# Patient Record
Sex: Female | Born: 2000 | Race: White | Hispanic: No | Marital: Single
Health system: Northeastern US, Academic
[De-identification: ages and names within clinical notes are randomized; demographics above are authoritative.]

---
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
## 2021-03-08 NOTE — Progress Notes (Signed)
* * *        **Carolyn Reed    --- ---    22 Y old Female, DOB: [birth year], External MRN: [id number]    Account Number: [id number]    [address] [place], [id number]    Home: [phone]    Insurance: [employer] PPO Payer ID: PAPER    PCP: Renetta Chalk, MD Referring: Renetta Chalk, MD External Visit ID:  [id number]    Appointment Facility: [facility]        * * *    10/19/2017  PN: Merwyn Katos, MD **CHN#:** [id number]    --- ---    ---        Current Medications    ---    Taking     * None     ---    * Medication List reviewed and reconciled with the patient    ---      Past Medical History    ---       Medical History Verified.[name]        ---      Surgical History    ---      tonsils    ---      Family History    ---      gf-bladder,htn.    ---      Social History    ---    Tobacco history: Never smoked.    Sexual history  Never sexually active.    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    Exercise: soccer.      Gyn History    ---    GPAL: g0.      Allergies    ---      N.K.D.A.    ---    [name] Verified]      Hospitalization/Major Diagnostic Procedure    ---      Denies Past Hospitalization    ---      Review of Systems    ---     _OB/GYN ROS_ :    General no fevers,no weakness,no memory loss,no swollen glands,no bruising,no  weight loss,no constant fatigue,no physical abuse,no emotional abuse. HEENT no  visual problems,no eye pain,no ear pain,no difficulty hearing,no headaches,no  sinus trouble. Skin no changing moles,no rash,no jaundice. Cardiovascular no  chest pains,no palpitations,no swelling,no dizziness,no murmurs. Respiratory  no shortness of breath,no cough,no wheezing. GI no nausea,no vomiting,no loss  of appetite,no constipation,no diarrhea. Urinary no painful urination,no  leakage of urine,no frequent urination,no blood in urine. Musculoskeletal no  joint swelling,no joint stiffness. Breast no breast lumps,no breast pain,no  nipple discharge. Psych no depression,no anxiety.             Reason for Appointment    ---      1\. NP/SECONDAY AMENORRHEA    ---      History of Present Illness    ---     _GENERAL_ :    oligomenorrhea    03/2016 had one day of cramping and 1-2 days of bleeding    06/2016    06/2017    not sexually active    no hot flashes    denies hirsutism, headaches, vision changes    no issues w bowels/bladder    no issues w acne    exercises on treadmill, soceer, swimming, and track    denies dieting    denies stressors    sleeps 8+ hrs a day.      Vital Signs    ---  Pain scale 0, Ht-in 65, Wt-lbs 128, BMI 21.30, BP 112/70.      Physical Examination    ---     _OB/Gyn General_ :    General Appearance: well-appearing, no acute distress. Mental Status: alert  and oriented. Mood/Affect: pleasant.    _OB/Gyn Neck_ :    Thyroid: unremarkable. Neck Mass: none.    _OB/Gyn Chest/Thorax_ :    Breath sounds: clear to auscultation. Respiratory Effort normal.    _OB/Gyn Heart_ :    Rhythm: regular. Murmurs: none. Rate: normal.    _OB/Gyn Breasts_ :    General: no masses, tenderness, skin changes, or nipple abnormality, Tanner  stage 4. Skin: unremarkable. Axilla: no palpable masses.    _OB/Gyn Abdomen_ :    General: soft. Tenderness: nontender. Masses: none. Hernia: absent.  Distention: none.    _OB/Gyn Skin_ :    General: warm, moist, no rash.    _OB/Gyn Genitourinary_ :    External Genitalia: normal; tanner 4-5.          Assessments    ---    1\. 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 Primary oligomenorrhea**    _LAB: Thyroid Stimulating Hormone (TSH)_ 1.33   Thyroid Stimulating Hormone  (TSH)  1.33    0.35 - 4.94 - uIU/mL    --- --- --- ---      Judie Bonus 10/19/2017 3:08:26 PM >    --- ---    Gerald Stabs: Prolactin (PROL)_ 7.1 Prolactin  7.1    5.2 - 26.5 - ng/mL    --- --- --- ---      Judie Bonus 10/19/2017 3:08:21 PM >    --- ---    Gerald Stabs: Follicular Stimulating Hormone (FSH)_ 2.7 Follicular Stimulating  Hormone  2.7     \- mIU/mL    --- --- --- ---       Judie Bonus 10/19/2017 3:08:16 PM >    --- ---    Gerald Stabs: Lutenizing Hormone (LH)_ 0.5 Lutenizing Hormone (LH)  0.5     \- mIU/mL    --- --- --- ---      Judie Bonus 10/19/2017 3:08:37 PM >    --- ---    Gerald Stabs: Beta HCG Quant (HCG)_ neg Beta HCG Quant  <5.0    0.0 - 5.0 - mIU/mL    --- --- --- ---      Judie Bonus 10/19/2017 3:08:32 PM >    --- ---    _LAB: DHEA-Sulfate (DHEAS)_    _LAB: 17-Hydroxyprogesterone (OHPRO)_    _LAB: Testosterone Free/Total (TESFT)_    Notes: 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      Follow Up    ---    1 Year    Electronically signed by Merwyn Katos , MD on 10/20/2017 at 09:07 PM EST    Sign off status: Completed        * * *        [facility]    [address] [place], [place] [address]    Tel: [phone]    Fax: [phone]              * * *  Patient: Carolyn Reed, Carolyn Reed DOB: 2001/04/11 Progress Note: Merwyn Katos,  MD 10/19/2017    ---    Note generated by eClinicalWorks EMR/PM Software ([email or web address])

## 2021-03-08 NOTE — Progress Notes (Signed)
* * *        **Carolyn Reed    --- ---    57 Y old Female, DOB: 09-05-2001, External MRN: [id number]    Account Number: [id number]    [address] [place], [id number]    Home: [phone]    Insurance: C[employer] PPO Payer ID: PAPER    PCP: Renetta Chalk, MD Referring: Renetta Chalk, MD External Visit ID:  [id number]    Appointment Facility: [facility]        * * *    09/06/2018  Progress Notes: Merwyn Katos, MD **CHN#:** [id number]    --- ---    ---         **Current Medications**    ---    Taking     * Loestrin Fe 1/20 1-20 MG-MCG Tablet 1 tablet Orally Once a day    ---    * Loestrin Fe 1/20 1-20 MG-MCG Tablet 1 tablet Orally Once a day    ---    * None     ---      **Surgical History**    ---       tonsils    ---      **Family History**    ---       gf-bladder,htn.    ---       **Social History**    ---    Exercise: soccer.    Sexual history  Never sexually active.    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    Tobacco history: Never smoked.   \+ cross country running.    ---       **Review of Systems**    ---     _OB/GYN ROS_ :    General no fevers, no weakness, no memory loss, no swollen glands, no  bruising, no weight loss, no constant fatigue, no physical abuse, no emotional  abuse. HEENT no visual problems, no eye pain, no ear pain, no difficulty  hearing, no headaches, no sinus trouble. Skin no changing moles, no rash, no  jaundice. Cardiovascular no chest pains, no palpitations, no swelling, no  dizziness, no murmurs. Respiratory no shortness of breath, no cough, no  wheezing. GI no nausea, no vomiting, no loss of appetite, no constipation, no  diarrhea. Urinary no painful urination, no leakage of urine, no frequent  urination, no blood in urine. Musculoskeletal no joint swelling, no joint  stiffness. Breast no breast lumps, no breast pain, no nipple discharge. Psych  no depression, no anxiety.            **Reason for Appointment**    ---       1\. BC FOLLOWUP    ---       **History of  Present Illness**    ---     _GENERAL_ :    tolerating ocps well    happy to have menses q month    minimal cramping.       **Vital Signs**    ---    Pain scale 0, Ht-in 65, Wt-lbs 127, BMI 21.13, BP 100/70.       **Physical Examination**    ---     _OB/Gyn General_ :    General Appearance: well-appearing, no acute distress. Mental Status: alert  and oriented. Mood/Affect: pleasant.          **Assessments**    ---    1\. 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 Primary oligomenorrhea**    Notes: reviewed HPO axis maturity    reviewed how cross country running can cause hypo hypo    pt desires stopping ocps    if no menses x 3 months, will repeat labs, consider resuming ocps    will call.    ---      **Follow Up**    ---    prn    Electronically signed by Merwyn Katos , MD on 09/06/2018 at 04:05 PM EDT    Sign off status: Completed        * * [facility]    [address] [place], [place] [address]    Tel: [phone]    Fax: [phone]              * * *          Patient: Carolyn Reed, Carolyn Reed DOB: 2000-12-16 Progress Note: Merwyn Katos,  MD 09/06/2018    ---    Note generated by eClinicalWorks EMR/PM Software ([email or web address])

## 2021-03-08 NOTE — Progress Notes (Signed)
* * *        **  [name]    --- ---    76 Y old Female, DOB: 03-04-2001    [address] [place], [place] [address]    Home: [phone]    Provider: Merwyn Katos        * * *    Telephone Encounter    ---    Answered by   Meyer Russel  Date: 08/08/2018         Time: 08:30 AM    Caller   pt mother    --- ---            Reason   rx refill            Message                      Hi,            Pt is calling for her refill for her bc Loestrin Fe 1/20 1-20 MG-MCG Tablet at the confirmed pharmacy above. Please reach out [phone].            Thank You                Action Taken                      Atocha,Jessica  08/08/2018 8:33:42 AM >       Bray,Paula  08/08/2018 8:40:25 AM > pt seen in [facility] 10/19/17.  discussed ocp. mpm called 02/2018 and pt. was prescribed loestrin fe 1/20.   mom was informed needs 4 months f/u after initiating ocp.  refills sent mom informed pt needs f/u appt. advised call [facility] to schedule number given                       Notes                      Hi,            Pt is calling requesting her refill for her bc Loestrin Fe 1/20 1-20 MG-MCG Tablet to the confirmed pharmacy. Please reach out at [phone].            Thank You                Refills  Start Loestrin Fe 1/20 Tablet, 1-20 MG-MCG, Orally, 28, 1 tablet,  Once a day, 28 day(s), Refills=2    --- ---          * * *                ---          * * *          Patient: Carolyn Reed DOB: 2001/02/15 Provider: Jennelle Human THAO  08/08/2018    ---    Note generated by eClinicalWorks EMR/PM Software ([email or web address])

## 2021-03-08 NOTE — Progress Notes (Signed)
* * *        **  Carolyn Reed    --- ---    41 Y old Female, DOB: 05/07/2001    [address] [place], [place] [address]    Home: [phone]    Provider: Merwyn Katos        * * *    Telephone Encounter    ---    Answered by   Jessie Foot  Date: 02/15/2018         Time: 12:24 PM    Caller   patient's mom Christin    --- ---            Reason   starting BC pills            Message                      Hello,            During last visit last Nov, pt mom was told that pt should start BC pills. Would like to know how to go about getting pt started on bc. Best number for pt mom Christin is [phone].            Thank you.                Action Taken                      Lettsome,Paulette  02/15/2018 12:26:40 PM >       Puccio,Linda , RN 02/15/2018 1:06:39 PM > lvmtcb, pt sat Dr Evelena Asa in November, discussed option of ocp's but note doesn't look like any pill was prescribed.      Puccio,Linda , RN 02/15/2018 4:13:45 PM > Dr T, do you need to see this patient again, or do you want to prescribe something that we can send?            let's prescribe loestrin 1/20. thanks.            [name],[name]  02/15/2018 4:30:19 PM >       Bray,Paula  02/18/2018 10:15:59 AM >  spoke to pts mom. pt with no menses. spotting only.  advised rx sent loestrin fe 1/20.  start sunday.  call to schedule f/u appt in 4 months. call me if questions,problems                Refills  Start Loestrin Fe 1/20 Tablet, 1-20 MG-MCG, Orally, 84 Tablet, 1  tablet, Once a day, 84 days, Refills=1    --- ---          * * *                ---          * * *          Patient: Carolyn Reed DOB: 09/30/2001 Provider: Jennelle Human THAO  02/15/2018    ---    Note generated by eClinicalWorks EMR/PM Software ([email or web address])

## 2022-03-05 IMAGING — DX DG KNEE COMPLETE 4+V*L*
4 series · 4 of 4 positions shown · non-contrast
Comparison: None.

CLINICAL DATA: Pain.

EXAM:
LEFT KNEE - COMPLETE 4+ VIEW

[knee ap]
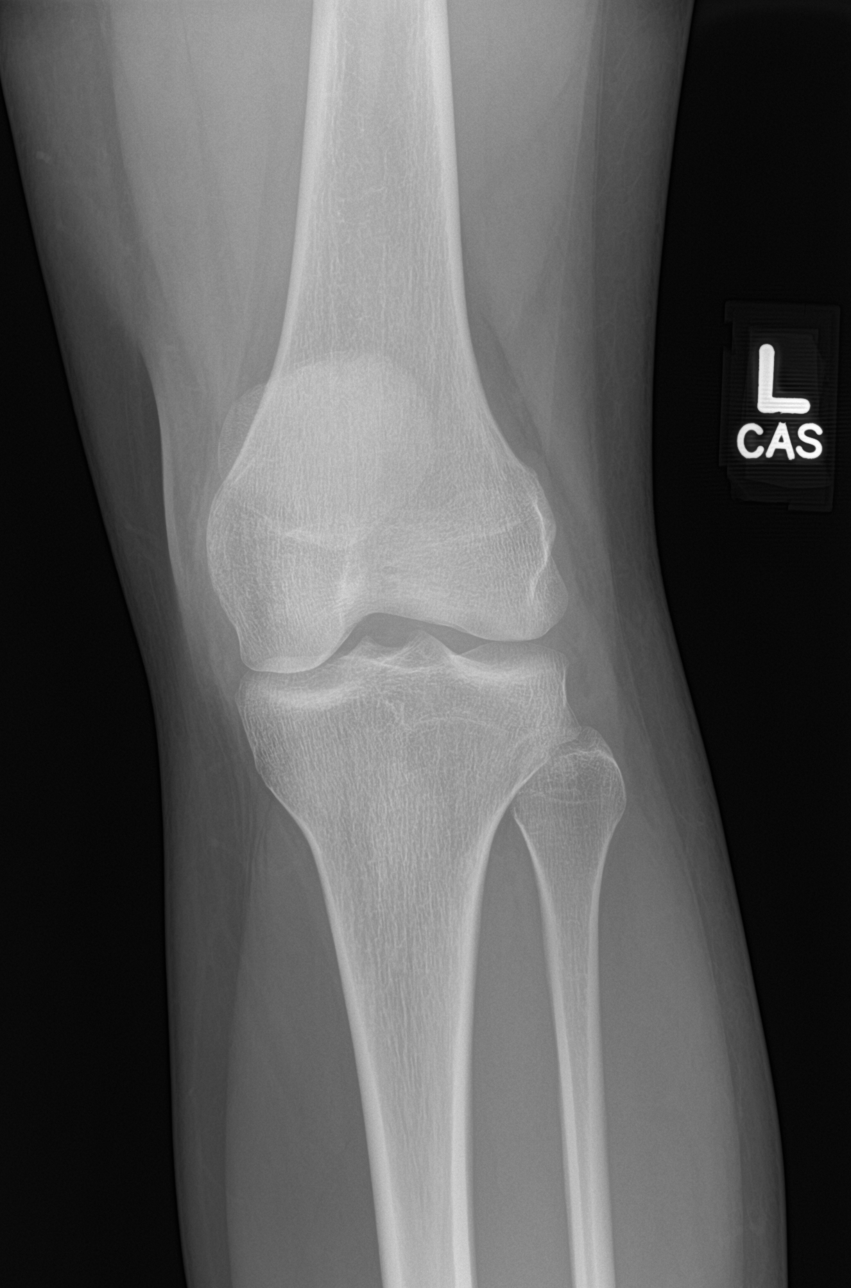

[knee lat]
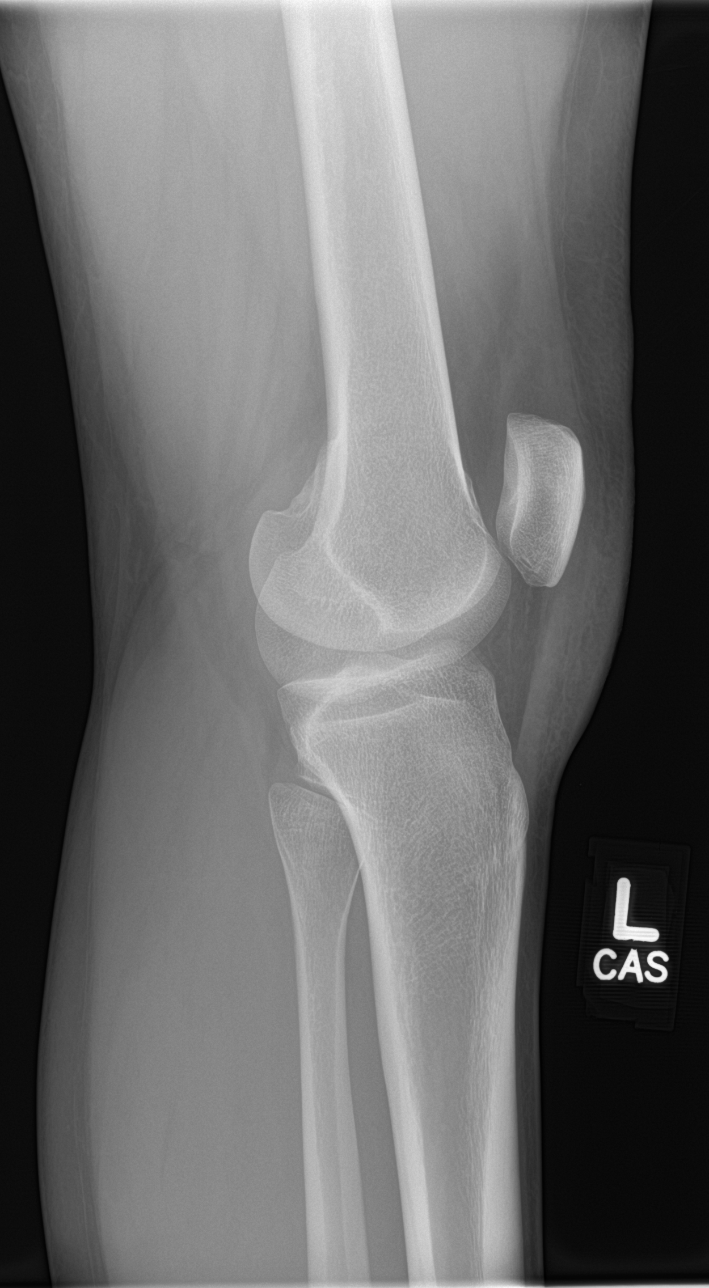

[knee obl (1 of 2)]
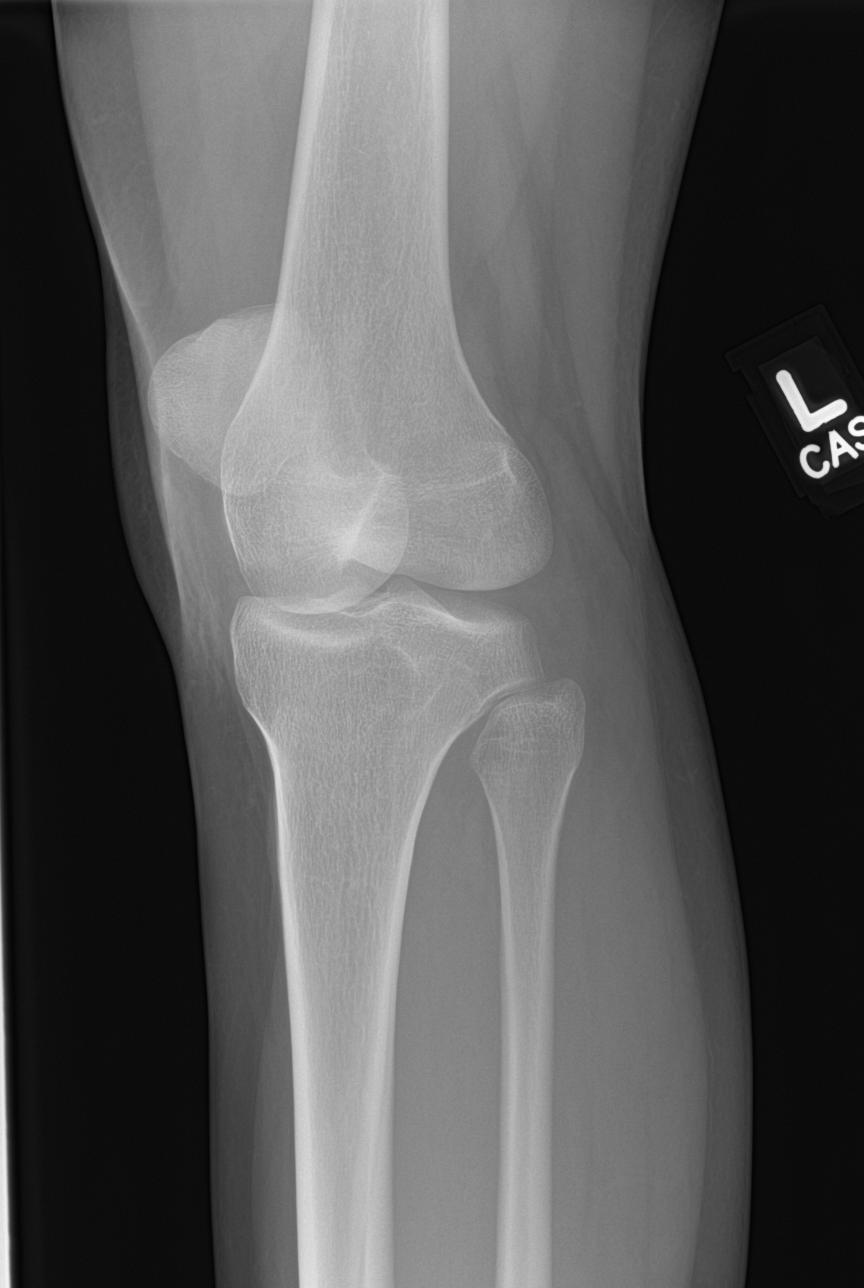

[knee obl (2 of 2)]
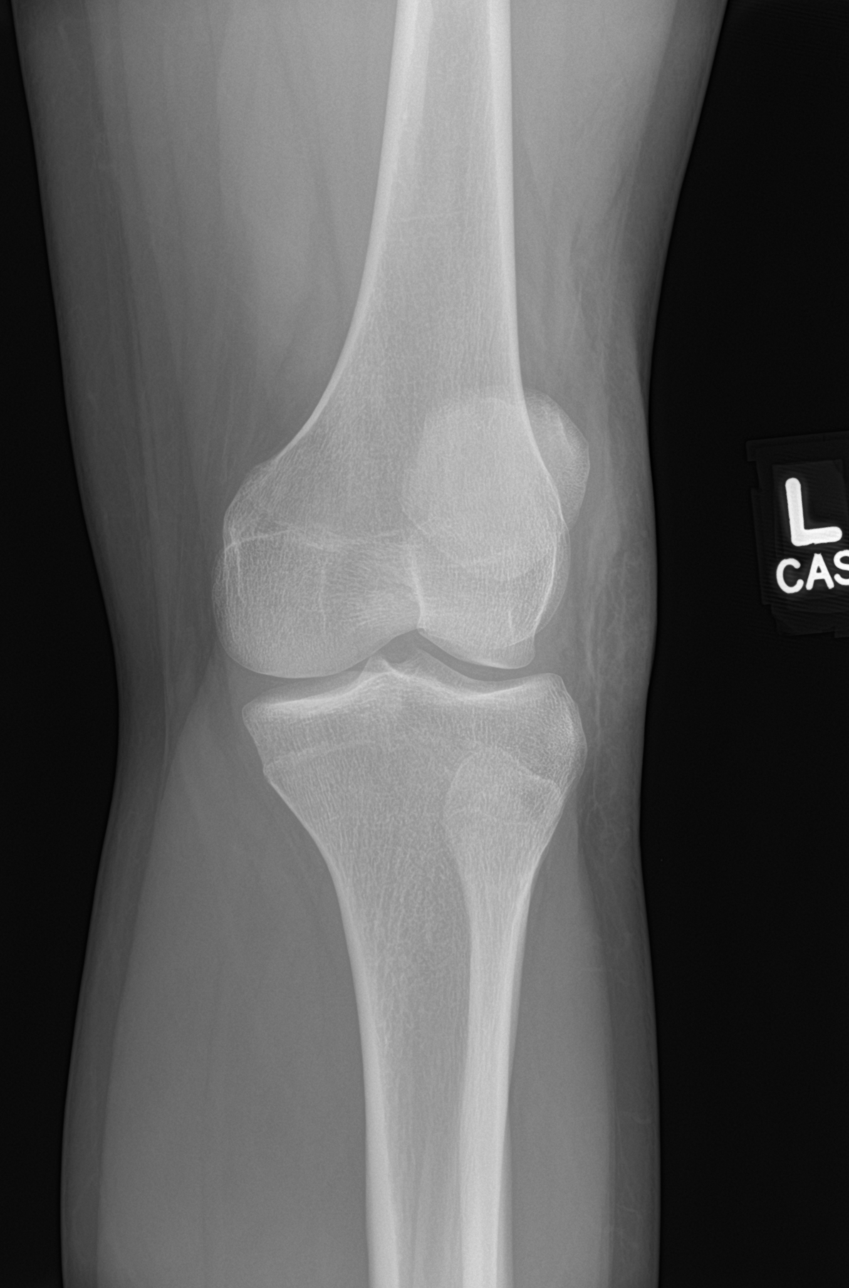

[4 of 4 positions shown; findings below may reference images not displayed]

FINDINGS: There is prepatellar soft tissue swelling. There is no acute
displaced fracture or dislocation. There is no significant joint
effusion.
IMPRESSION: Prepatellar soft tissue swelling. No acute displaced fracture or
dislocation.
